# Patient Record
Sex: Male | Born: 1935 | Race: White | Hispanic: No | Marital: Married
Health system: Southern US, Community
[De-identification: ages and names within clinical notes are randomized; demographics above are authoritative.]

---

## 2004-10-22 ENCOUNTER — Emergency Department: Payer: Self-pay | Admitting: Unknown Physician Specialty

## 2004-10-22 ENCOUNTER — Inpatient Hospital Stay (HOSPITAL_COMMUNITY): Admission: EM | Admit: 2004-10-22 | Discharge: 2004-10-24 | Payer: Self-pay | Admitting: *Deleted

## 2004-12-08 ENCOUNTER — Ambulatory Visit: Payer: Self-pay | Admitting: Family Medicine

## 2004-12-27 ENCOUNTER — Ambulatory Visit: Payer: Self-pay | Admitting: Family Medicine

## 2006-06-19 ENCOUNTER — Inpatient Hospital Stay: Payer: Self-pay | Admitting: Gastroenterology

## 2008-01-29 ENCOUNTER — Ambulatory Visit: Payer: Self-pay | Admitting: Family Medicine

## 2008-06-02 ENCOUNTER — Ambulatory Visit: Payer: Self-pay | Admitting: Family Medicine

## 2008-06-11 ENCOUNTER — Ambulatory Visit: Payer: Self-pay | Admitting: Family Medicine

## 2008-06-12 ENCOUNTER — Ambulatory Visit: Payer: Self-pay | Admitting: Family Medicine

## 2008-07-16 ENCOUNTER — Ambulatory Visit: Payer: Self-pay | Admitting: Vascular Surgery

## 2008-07-23 ENCOUNTER — Inpatient Hospital Stay: Payer: Self-pay | Admitting: Vascular Surgery

## 2009-06-16 ENCOUNTER — Ambulatory Visit: Payer: Self-pay | Admitting: Gastroenterology

## 2011-01-21 ENCOUNTER — Ambulatory Visit: Payer: Self-pay | Admitting: Family Medicine

## 2011-02-01 ENCOUNTER — Other Ambulatory Visit (HOSPITAL_COMMUNITY): Payer: Self-pay | Admitting: Neurosurgery

## 2011-02-04 ENCOUNTER — Ambulatory Visit (HOSPITAL_COMMUNITY)
Admission: RE | Admit: 2011-02-04 | Discharge: 2011-02-04 | Disposition: A | Discharge status: Home / Self Care | Payer: Medicare Other | Source: Ambulatory Visit | Attending: Neurosurgery | Admitting: Neurosurgery

## 2011-02-04 DIAGNOSIS — J3489 Other specified disorders of nose and nasal sinuses: Secondary | ICD-10-CM | POA: Insufficient documentation

## 2011-02-04 DIAGNOSIS — F29 Unspecified psychosis not due to a substance or known physiological condition: Secondary | ICD-10-CM | POA: Insufficient documentation

## 2011-02-04 DIAGNOSIS — R221 Localized swelling, mass and lump, neck: Secondary | ICD-10-CM | POA: Insufficient documentation

## 2011-02-04 DIAGNOSIS — D320 Benign neoplasm of cerebral meninges: Secondary | ICD-10-CM | POA: Insufficient documentation

## 2011-02-04 DIAGNOSIS — R220 Localized swelling, mass and lump, head: Secondary | ICD-10-CM | POA: Insufficient documentation

## 2011-02-04 DIAGNOSIS — G319 Degenerative disease of nervous system, unspecified: Secondary | ICD-10-CM | POA: Insufficient documentation

## 2011-02-04 MED ORDER — GADOBENATE DIMEGLUMINE 529 MG/ML IV SOLN
17.0000 mL | Freq: Once | INTRAVENOUS | Status: AC | PRN
  Administered 2011-02-04: 17 mL via INTRAVENOUS

## 2011-03-01 ENCOUNTER — Other Ambulatory Visit (HOSPITAL_COMMUNITY): Payer: Self-pay | Admitting: Neurosurgery

## 2011-03-01 DIAGNOSIS — D329 Benign neoplasm of meninges, unspecified: Secondary | ICD-10-CM

## 2011-03-21 ENCOUNTER — Ambulatory Visit (HOSPITAL_COMMUNITY)
Admission: RE | Admit: 2011-03-21 | Discharge: 2011-03-21 | Disposition: A | Discharge status: Home / Self Care | Payer: Medicare Other | Source: Ambulatory Visit | Attending: Neurosurgery | Admitting: Neurosurgery

## 2011-03-21 ENCOUNTER — Other Ambulatory Visit (HOSPITAL_COMMUNITY): Payer: Self-pay | Admitting: Neurosurgery

## 2011-03-21 ENCOUNTER — Encounter (HOSPITAL_COMMUNITY)
Admission: RE | Admit: 2011-03-21 | Discharge: 2011-03-21 | Disposition: A | Discharge status: Home / Self Care | Payer: Medicare Other | Source: Ambulatory Visit | Attending: Neurosurgery | Admitting: Neurosurgery

## 2011-03-21 DIAGNOSIS — Z01812 Encounter for preprocedural laboratory examination: Secondary | ICD-10-CM | POA: Insufficient documentation

## 2011-03-21 DIAGNOSIS — J4489 Other specified chronic obstructive pulmonary disease: Secondary | ICD-10-CM | POA: Insufficient documentation

## 2011-03-21 DIAGNOSIS — Z01818 Encounter for other preprocedural examination: Secondary | ICD-10-CM | POA: Insufficient documentation

## 2011-03-21 DIAGNOSIS — J449 Chronic obstructive pulmonary disease, unspecified: Secondary | ICD-10-CM | POA: Insufficient documentation

## 2011-03-21 LAB — CBC
Hemoglobin: 16.5 g/dL (ref 13.0–17.0)
MCH: 31.1 pg (ref 26.0–34.0)
Platelets: 194 10*3/uL (ref 150–400)
RBC: 5.31 MIL/uL (ref 4.22–5.81)

## 2011-03-21 LAB — PROTIME-INR
INR: 1.04 (ref 0.00–1.49)
Prothrombin Time: 13.8 seconds (ref 11.6–15.2)

## 2011-03-21 LAB — BASIC METABOLIC PANEL
BUN: 12 mg/dL (ref 6–23)
Calcium: 10.2 mg/dL (ref 8.4–10.5)
GFR calc Af Amer: >60 mL/min (ref >60)
GFR calc non Af Amer: >60 mL/min (ref >60)
Glucose, Bld: 92 mg/dL (ref 70–99)
Potassium: 4.1 mEq/L (ref 3.5–5.1)
Sodium: 137 mEq/L (ref 135–145)

## 2011-03-21 LAB — TYPE AND SCREEN: ABO/RH(D): O POS

## 2011-03-21 LAB — SURGICAL PCR SCREEN: Staphylococcus aureus: NEGATIVE

## 2011-03-21 LAB — ABO/RH: ABO/RH(D): O POS

## 2011-03-25 ENCOUNTER — Ambulatory Visit
Admission: RE | Admit: 2011-03-25 | Discharge: 2011-03-25 | Disposition: A | Discharge status: Home / Self Care | Payer: Medicare Other | Source: Ambulatory Visit | Attending: Neurosurgery | Admitting: Neurosurgery

## 2011-03-25 ENCOUNTER — Other Ambulatory Visit: Payer: Self-pay | Admitting: Neurosurgery

## 2011-03-25 DIAGNOSIS — I729 Aneurysm of unspecified site: Secondary | ICD-10-CM

## 2011-03-25 MED ORDER — IOHEXOL 300 MG/ML  SOLN
100.0000 mL | Freq: Once | INTRAMUSCULAR | Status: AC | PRN
  Administered 2011-03-25: 100 mL via INTRAVENOUS

## 2011-03-30 ENCOUNTER — Other Ambulatory Visit (HOSPITAL_COMMUNITY): Payer: PRIVATE HEALTH INSURANCE

## 2011-03-30 ENCOUNTER — Inpatient Hospital Stay (HOSPITAL_COMMUNITY): Admission: RE | Admit: 2011-03-30 | Payer: Medicare Other | Source: Ambulatory Visit | Admitting: Neurosurgery

## 2011-04-05 ENCOUNTER — Other Ambulatory Visit: Payer: Self-pay | Admitting: Family Medicine

## 2011-04-08 ENCOUNTER — Ambulatory Visit
Admission: RE | Admit: 2011-04-08 | Discharge: 2011-04-08 | Disposition: A | Discharge status: Home / Self Care | Payer: Medicare Other | Source: Ambulatory Visit | Attending: Family Medicine | Admitting: Family Medicine

## 2011-04-08 MED ORDER — IOHEXOL 300 MG/ML  SOLN
100.0000 mL | Freq: Once | INTRAMUSCULAR | Status: AC | PRN
  Administered 2011-04-08: 100 mL via INTRAVENOUS

## 2011-08-11 ENCOUNTER — Ambulatory Visit: Payer: Self-pay | Admitting: Urology

## 2011-09-20 ENCOUNTER — Ambulatory Visit: Payer: Self-pay | Admitting: Urology

## 2011-09-20 LAB — CBC WITH DIFFERENTIAL/PLATELET
Basophil %: 0.8 %
Eosinophil #: 0.3 10*3/uL (ref 0.0–0.7)
Eosinophil %: 5.2 %
HCT: 45.5 % (ref 40.0–52.0)
HGB: 15.1 g/dL (ref 13.0–18.0)
Lymphocyte %: 25.9 %
MCHC: 33.1 g/dL (ref 32.0–36.0)
Monocyte %: 7 %
Neutrophil #: 3.9 10*3/uL (ref 1.4–6.5)
Neutrophil %: 61.1 %
WBC: 6.4 10*3/uL (ref 3.8–10.6)

## 2011-09-20 LAB — BASIC METABOLIC PANEL
Anion Gap: 6 — ABNORMAL LOW (ref 7–16)
BUN: 20 mg/dL — ABNORMAL HIGH (ref 7–18)
Co2: 30 mmol/L (ref 21–32)
Creatinine: 1.04 mg/dL (ref 0.60–1.30)
EGFR (African American): >60
EGFR (Non-African Amer.): >60
Glucose: 80 mg/dL (ref 65–99)
Sodium: 140 mmol/L (ref 136–145)

## 2011-10-04 ENCOUNTER — Other Ambulatory Visit: Payer: Self-pay

## 2011-10-04 ENCOUNTER — Ambulatory Visit: Payer: Self-pay | Admitting: Urology

## 2011-10-05 LAB — PATHOLOGY REPORT

## 2011-11-26 ENCOUNTER — Observation Stay: Payer: Self-pay | Admitting: Internal Medicine

## 2011-11-26 LAB — COMPREHENSIVE METABOLIC PANEL
Alkaline Phosphatase: 49 U/L — ABNORMAL LOW (ref 50–136)
Calcium, Total: 8.9 mg/dL (ref 8.5–10.1)
Co2: 33 mmol/L — ABNORMAL HIGH (ref 21–32)
EGFR (Non-African Amer.): >60
SGOT(AST): 17 U/L (ref 15–37)
SGPT (ALT): 27 U/L

## 2011-11-26 LAB — URINALYSIS, COMPLETE
Bacteria: NONE SEEN
Bilirubin,UR: NEGATIVE
Glucose,UR: NEGATIVE mg/dL (ref 0–75)
Nitrite: NEGATIVE
Specific Gravity: 1.009 (ref 1.003–1.030)
WBC UR: NONE SEEN /HPF (ref 0–5)

## 2011-11-26 LAB — CBC
MCH: 32.9 pg (ref 26.0–34.0)
MCHC: 34 g/dL (ref 32.0–36.0)
MCV: 97 fL (ref 80–100)
Platelet: 104 10*3/uL — ABNORMAL LOW (ref 150–440)
RDW: 15.8 % — ABNORMAL HIGH (ref 11.5–14.5)
WBC: 9.5 10*3/uL (ref 3.8–10.6)

## 2011-11-26 LAB — CK TOTAL AND CKMB (NOT AT ARMC)
CK, Total: 36 U/L (ref 35–232)
CK-MB: 3.1 ng/mL (ref 0.5–3.6)

## 2011-11-26 LAB — TROPONIN I: Troponin-I: 0.08 ng/mL — ABNORMAL HIGH

## 2011-12-05 DEATH — deceased

## 2013-01-14 IMAGING — CT CT HEAD WITHOUT CONTRAST
3 of 4 series · 16 of 30 positions shown, 18 images · non-contrast
Comparison: none

REASON FOR EXAM: weakness
COMMENTS:

[Series 2: without · axial · non-contrast · 0.45mm/px · z∈[-73,+27]mm · 5 of 31 slices shown (1 of 2)]
[im 6/31  brain]
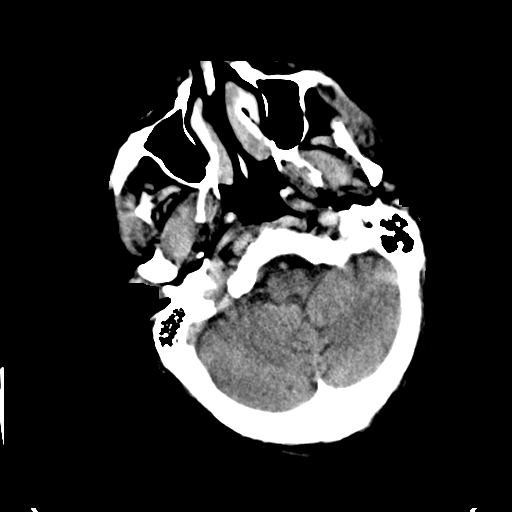
[im 11/31  brain]
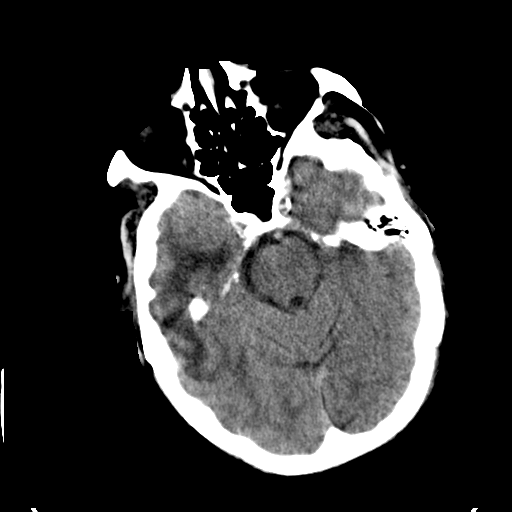
[im 16/31  brain]
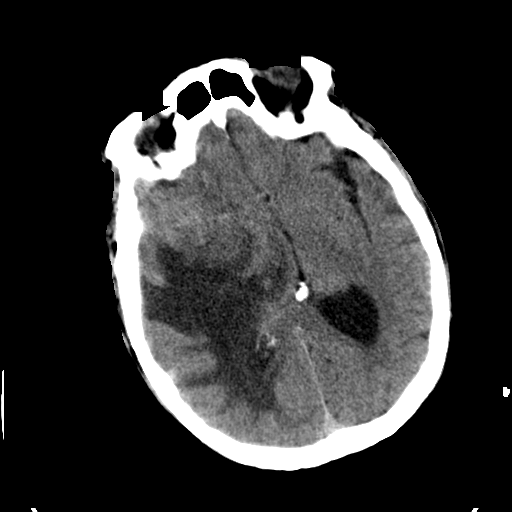
[im 21/31  brain]
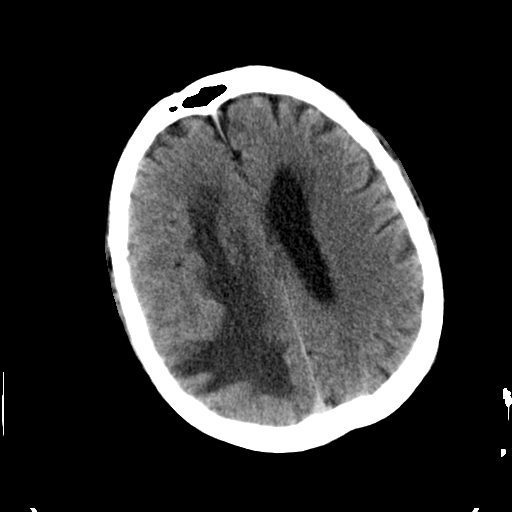
[im 26/31  brain]
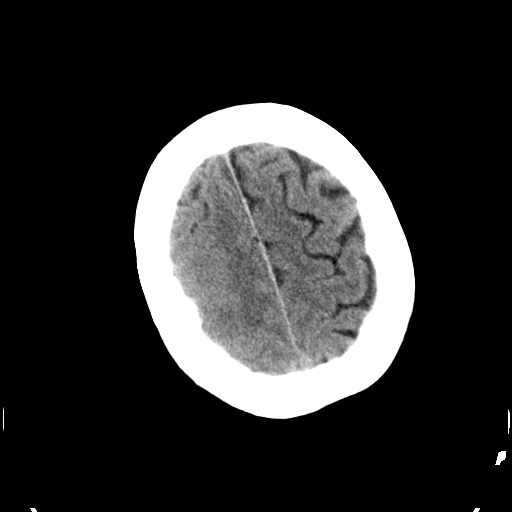

[Series 4: without · axial · non-contrast · 0.45mm/px · z∈[-73,+27]mm · 5 of 30 slices shown, 7 images (2 of 2)]
[im 5/30  brain]
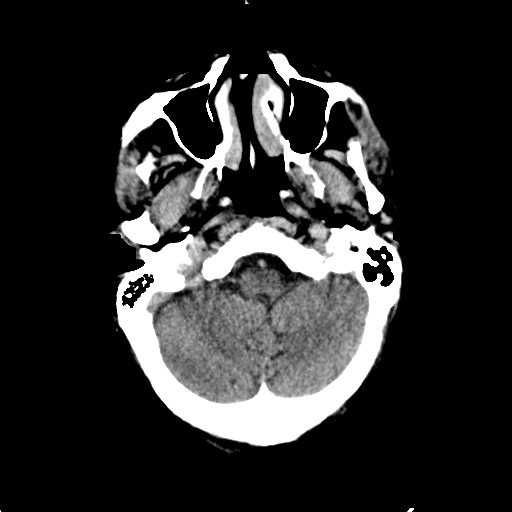
[im 5/30  bone]
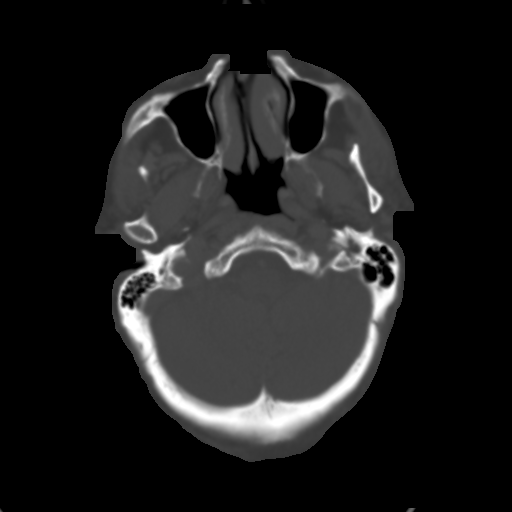
[im 10/30  brain]
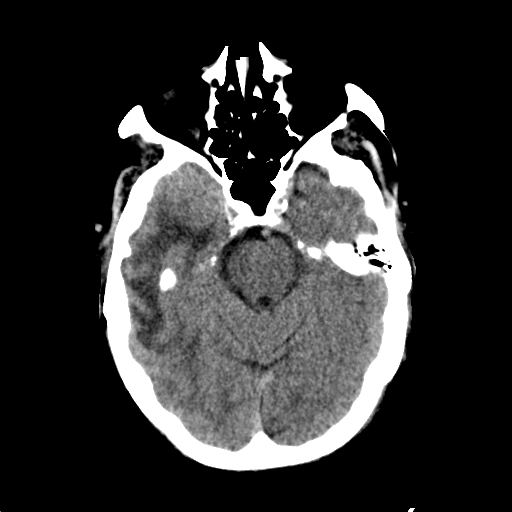
[im 15/30  brain]
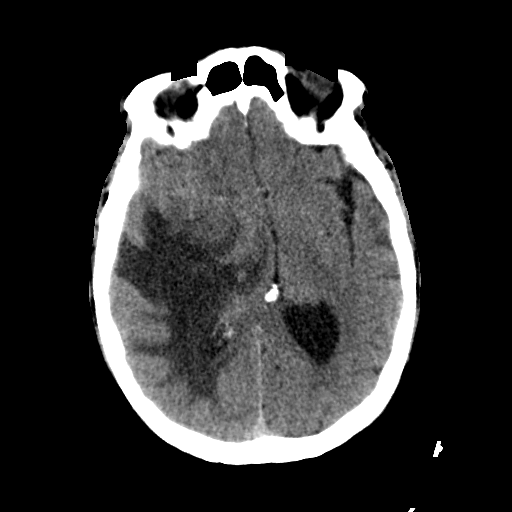
[im 20/30  brain]
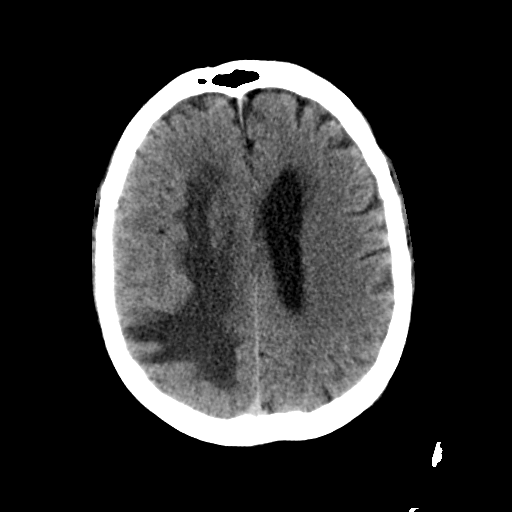
[im 25/30  brain]
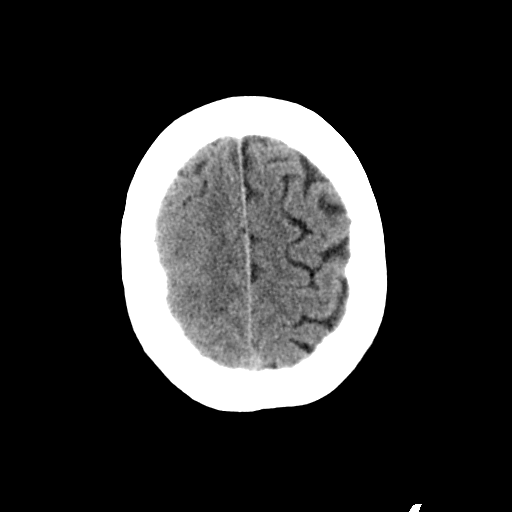
[im 25/30  bone]
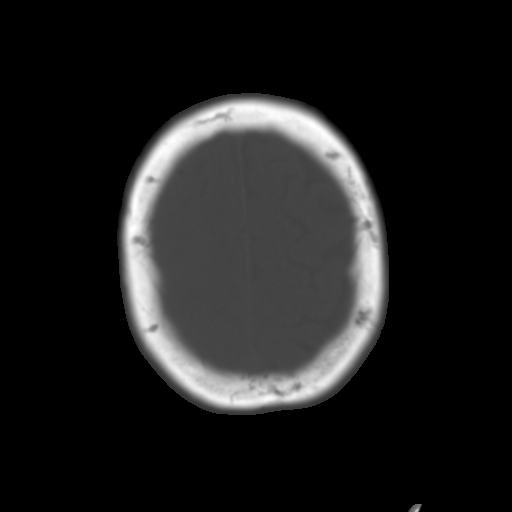

[Series 5: bone · axial · 0.45mm/px · z∈[-81,+29]mm · 6 of 32 slices shown]
[im 5/32  bone]
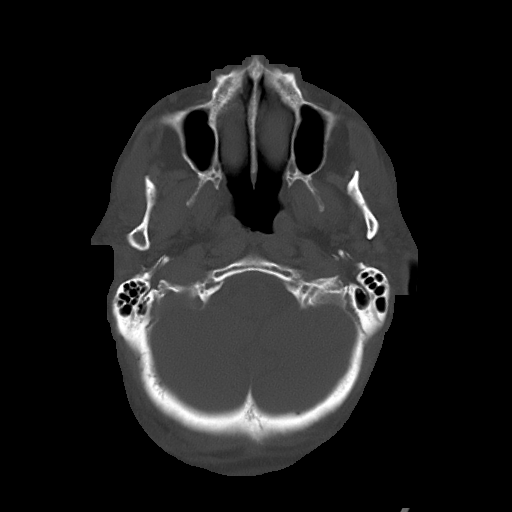
[im 9/32  bone]
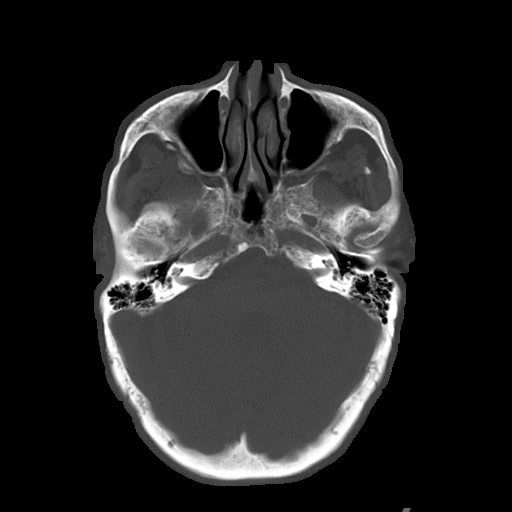
[im 14/32  bone]
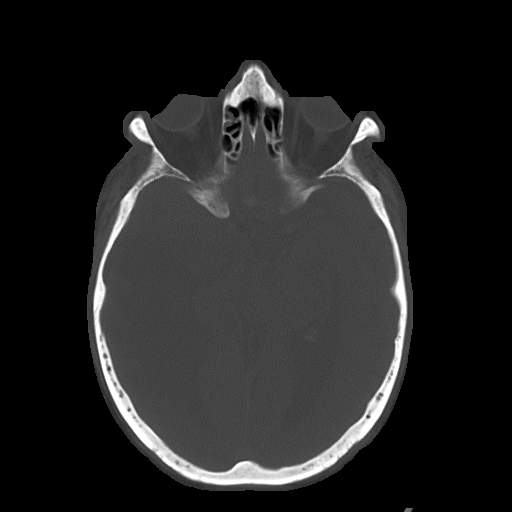
[im 18/32  bone]
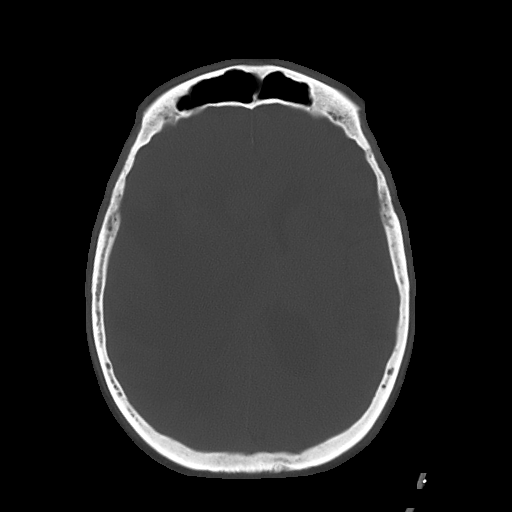
[im 23/32  bone]
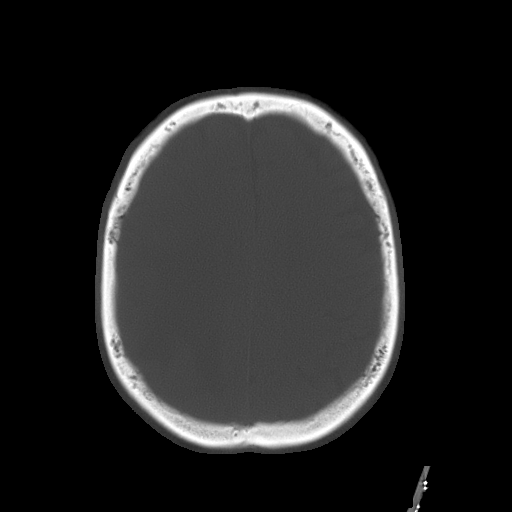
[im 27/32  bone]
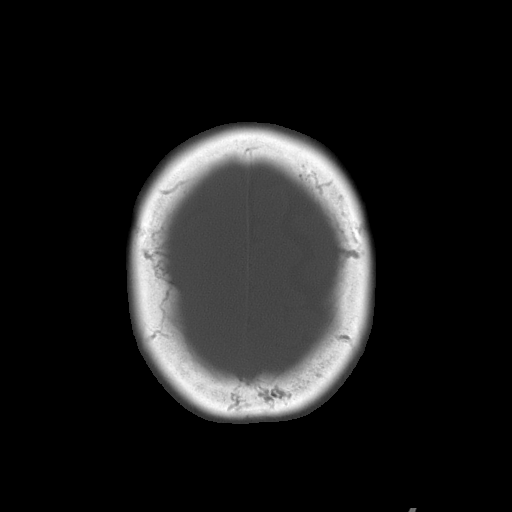

[16 of 30 positions shown; findings below may reference images not displayed]

PROCEDURE:     CT  - CT HEAD WITHOUT CONTRAST  - November 26, 2011  [DATE]

RESULT:     Axial noncontrast CT scanning was performed through the brain
with reconstructions at 5 mm intervals and slice thicknesses.

Comparison is made to the study 21 January, 2011.

There is a large amount of edema in the right cerebral hemisphere. There is
shift of the midline toward the left amounting to 15 mm. There is complete
effacement of the right lateral ventricle. There is mild dilation of the
left lateral ventricle. The third ventricle is nearly effaced. The fourth
ventricle is normal in appearance. There is suggestion of a mass in the
anterior aspect of the right frontal lobe abutting the superior aspect of
the temporal lobe. There is radiodensity associated with this which is not
new and is consistent with calcific. The cerebellum and brainstem are normal
in density. At bone window settings the observed portions of the paranasal
sinuses and mastoid air cells are clear. There is no evidence of an acute
skull fracture.
IMPRESSION: There has been a marked increase in the edema in the right
cerebral hemisphere with complete effacement of the right lateral ventricle
and shift of the midline toward the left amounting to it least 15 mm. I do
not see evidence of an acute intracranial hemorrhage. I suspect that there
is a mass in the inferior aspect of the right frontal lobe which may extend
into or from the anterior aspect of the right temporal lobe.

This report was called by me directly to Dr. Becerri in the emergency
department at the conclusion of the study.

## 2014-12-28 NOTE — Op Note (Signed)
PATIENT NAME:  Dalton Gilbert, Dalton Gilbert MR#:  [id number] DATE OF BIRTH:  11/27/1923  DATE OF PROCEDURE:  10/04/2011  PREOPERATIVE DIAGNOSIS: Left renal neoplasm, uncertain.   POSTOPERATIVE DIAGNOSIS: Left renal neoplasm, uncertain.   PROCEDURE: Left CT-guided percutaneous renal cryotherapy and biopsy.   SURGEON: Denice Bors. Jacqlyn Larsen, MD  ANESTHESIA: General endotracheal anesthesia.   INDICATIONS: The patient is an 81 year old gentleman who was found to have an approximate 2.2 cm enhancing anterior mid pole lesion within the left kidney on recent back MRI. The lesion is consistent on subsequent CT scan with probable renal cell carcinoma. He presents for percutaneous renal cryoablation.   DESCRIPTION OF PROCEDURE: After informed consent was obtained, the patient was taken to the Operating Room and placed in the lateral position left side up on the CT gantry under general endotracheal anesthesia. The patient was then prepped and draped in the usual standard fashion. Markers were placed on the overlying skin to identify the approximate site of the tumor. An initial scan was undertaken for this purpose. The bowel was noted to be in very close proximity to the tumor. The decision was made to proceed with hydrodistention for improved tissue planes for safety of the cryoablation. An 18-gauge spinal needle was inserted through the skin at the proper level. The needle was advanced down to between the first section of colon and renal capsule. Approximately 120 mL of saline was injected with good increase in distance between the kidney and the colon. The needle was then advanced beyond the colon to at least two loops of small bowel in close proximity. An additional 300 mL of saline was instilled with good increase in distance between the small intestine the renal tumor. This made more than an adequate distance to be able to proceed with the ablation. This also gave a more direct approach to the lesion. The biopsy sheath was  advanced through the skin at the same site. It was advanced to the level of the tumor. Three biopsies were obtained with a 16-gauge biopsy gun. The first piece of tumor appeared white in color, the other two appeared more renal tissue in color. No significant bleeding was encountered. The sheath was removed. A small skin incision was made approximately 1 cm lateral to the sheath site. The first cryoablation needle was inserted through this site. It was placed into the medial aspect of the tumor near the collecting system, the second needle was placed approximately 1 cm lateral at the same parallel. Both needles were noted to be in good position. They were advanced to the base of the tumor. Cryoablation was then begun. The first visualization at three minutes demonstrated good coverage of the tumor. Gentle traction was then placed on the needles to elevate the kidney slightly above the intestines, still adequate distance was present. The freeze was then taken to 10 minutes. A range scan was performed with contrast. This demonstrated good coverage of the entire zone of the tumor. The needles were then thawed for eight minutes. A second freeze was undertaken in similar fashion. Needle placement was confirmed prior to beginning the freeze. After the second freeze a thaw was undertaken for approximately 3-1/2 minutes with the needles then removed without difficulty. Once again, no bleeding was encountered. A Tegaderm and gauze dressing was placed. The patient was returned to the supine position and awakened from general endotracheal anesthesia, was taken to the recovery room in stable condition. There were no problems or complications. The patient tolerated the procedure well. Also of  note, the patient was noted to be in atrial fibrillation at the beginning of the procedure. The decision was made to proceed with the procedure at that point. It is unclear how long he had been in atrial fibrillation. Cardiology evaluation  will be arranged.   ____________________________ Denice Bors Jacqlyn Larsen, MD bsc:cms D: 10/04/2011 10:21:09 ET T: 10/04/2011 11:22:47 ET JOB#: [id number]  cc: Denice Bors. Jacqlyn Larsen, MD, <Dictator>  Denice Bors Josafat Enrico MD ELECTRONICALLY SIGNED 10/11/2011 7:17

## 2014-12-28 NOTE — Discharge Summary (Signed)
PATIENT NAME:  Dalton Gilbert, Dalton Gilbert MR#:  [id number] DATE OF BIRTH:  Jan 07, 1924  DATE OF ADMISSION:  11/26/2011 DATE OF DISCHARGE:  11/27/2011  DIAGNOSES:  1. Metastatic brain cancer.  2. Increasing confusion. 3. Encephalopathy. 4. Multiple falls. 5. Dehydration.  6. Thrombocytopenia.  7. Elevated troponins. 8. Hypokalemia. 9. Hypertension.   DISPOSITION: Patient is being discharged to hospice facility.   CODE STATUS: DO NOT RESUSCITATE.   LABORATORY, DIAGNOSTIC AND RADIOLOGICAL DATA: CT of the head showed increase in the edema in the right cerebral hemisphere and complete effacement of the right lateral ventricle. Urinalysis showed no evidence of infection. CBC normal other than platelet count of 104. Troponin 0.08. BUN 27, creatinine 2.84, potassium 2.9, bilirubin 1.7, ALT 49.   HOSPITAL COURSE: Patient is an 82 year old male with history of metastatic brain tumor who is followed by hospice at home, hypertension, coronary artery disease presented with multiple falls and increasing confusion. CAT scan of the head showed increasing edema and tumor size. Patient was also dehydrated with elevated troponins, hypokalemia. As per patient's daughter, Ms. Roberto Scales, patient was a DO NOT RESUSCITATE. He was living at home with his elderly wife and was followed by hospice, however, he has been increasingly confused and fell at home multiple times. His wife is unable to take care of him and they want hospice home placement. Case management was involved. Hospice facility was contacted and they had a bed. Patient is being transferred to hospice facility in a stable condition.     TIME SPENT: 45 minutes.   ____________________________ Cherre Huger, MD sp:cms D: 11/27/2011 14:28:26 ET T: 11/28/2011 10:09:35 ET JOB#: [id number]  cc: Cherre Huger, MD, <Dictator> Cherre Huger MD ELECTRONICALLY SIGNED 11/28/2011 16:41

## 2014-12-28 NOTE — H&P (Signed)
PATIENT NAME:  Dalton Gilbert, ULBRICH MR#:  [id number] DATE OF BIRTH:  06/17/1924  DATE OF ADMISSION:  11/26/2011  CHIEF COMPLAINT: Fall at home twice today.   HISTORY OF PRESENT ILLNESS: The patient is an 81 year old male with a history of brain tumor with metastasis, hypertension, coronary artery disease, now in Hospice care at home. The patient is confused, cannot provide any detailed information. According to his wife and son and daughter, the patient fell twice today. He is confused, sleepy. The patient has had a brain tumor for twelve years. Initially it was benign and then became malignant with metastasis recently, so the patient is placed in Hospice care at home. The patient's wife could not take care of him, so they sent the patient to the ED. Dr. Benjaman Lobe, ED physician, called Hospice facility, but there is no bed available tonight; so Dr. Benjaman Lobe admitted the patient for Hospice placement. The patient has hypokalemia and dehydration. He was treated with aspirin and IV fluids in the ED. Just now I talked with the patient's wife, daughter and son. His wife is Medical laboratory scientific officer.  I discussed with them about the patient's CODE STATUS. Finally, they decided to place the patient in DO NOT RESUSCITATE status.   PAST MEDICAL HISTORY:  1. Hypertension.  2. Coronary artery disease. 3. Malignant brain tumor.   SOCIAL HISTORY: No smoking, alcohol drinking, or illicit drugs.   PAST SURGICAL HISTORY: Left hip surgery.   FAMILY HISTORY: Unknown.   REVIEW OF SYSTEMS: The patient is confused, denies any symptoms.   ALLERGIES: No known drug allergies.   MEDICATIONS:  1. Aspirin 81 mg p.o. daily. 2. Benazepril/HCTZ 20 mg/25 mg p.o. b.i.d.  3. Crestor 20 mg p.o. at bedtime. 4. Dexamethasone 22 mg p.o. daily.  5. Donepezil 10 mg p.o. at bedtime.  6. Metoprolol p.o. daily.  7. Omeprazole 20 mg p.o. b.i.d.  8. Slow-Fe 1 daily. 9. Sucralfate 1 gram p.o. t.i.d.  10. Vitamin D 1.25 mg weekly.    PHYSICAL  EXAMINATION:  VITALS: Temperature 96.5, blood pressure 184/86, pulse 63, oxygen saturation 96%, respiration rate 18.   GENERAL: The patient is weak but confused, in no acute distress.   HEENT: Pupils are round, equal, reactive to light and accommodation. Dry oral mucosa. Clear oropharynx.   NECK: Supple. No JVD or carotid bruit. No lymphadenopathy. No thyromegaly.   CARDIOVASCULAR: S1, S2, irregular rate and rhythm. There is a heart murmur, about 3 out of 6.   PULMONARY: Bilateral air entry. No wheezing or rales.   ABDOMEN: Soft, no distention, no tenderness. No organomegaly. Bowel sounds are present.   EXTREMITIES: No edema, clubbing, or cyanosis. No calf tenderness.   SKIN: No rash or jaundice.   NEUROLOGIC: The patient is confused. He has left leg weakness, power 1 out of 5, but the right side is 5 out of 5.   LABORATORY, DIAGNOSTIC AND RADIOLOGICAL DATA:  Urinalysis is negative.  CAT scan of the brain shows marked increase in edema in the right cerebral hemisphere with complete effacement of the right lateral ventricle and a shift of the midline toward the left amounting to at least 15 mm. No acute intracranial hemorrhage. There is a mass in the inferior aspect of the right frontal lobe which may extend into or from the anterior aspect of the right temporal lobe.  CK 36, CK-MB 3.1. Glucose 89, BUN 27, creatinine 0.84, sodium 143, potassium 2.9, chloride 100, bicarbonate 33, bilirubin 1.7.  WBC 9.5, hemoglobin 16.7, platelets 104.  Troponin 0.08   IMPRESSION:  1. Fall.  2. Confusion. 3. Malignant brain tumor with metastasis.  4. Dehydration.  5. Hypokalemia.  6. Uncontrolled hypertension.  7. Coronary artery disease.   PLAN OF TREATMENT:  1. The patient will be placed for observation.  2. We will give supportive care and medications, Zofran p.r.n.  3. We will continue Lopressor, benazepril for hypertension, but discontinue HCTZ due to dehydration.  4. Continue  dexamethasone.  5. GI and deep vein thrombosis prophylaxis.  6. Case Management consult for Hospice placement.   I discussed the patient's situation and the plan of treatment. The patient's wife and son don't want aggressive treatment, just palliative care and supportive care. The patient will be discharged to Hospice when a bed is available.   TIME SPENT: About 60 minutes. ____________________________ Demetrios Loll, MD qc:cbb D: 11/26/2011 22:03:30 ET T: 11/27/2011 09:16:38 ET JOB#: [id number]  cc: Demetrios Loll, MD, <Dictator> Demetrios Loll MD ELECTRONICALLY SIGNED 11/27/2011 16:03
